# Patient Record
Sex: Male | Born: 1985 | Race: White | Marital: Married
Health system: Southern US, Community
[De-identification: ages and names within clinical notes are randomized; demographics above are authoritative.]

---
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
## 2020-09-06 ENCOUNTER — Emergency Department (HOSPITAL_COMMUNITY): Payer: 59

## 2020-09-06 ENCOUNTER — Observation Stay (HOSPITAL_COMMUNITY)
Admission: EM | Admit: 2020-09-06 | Discharge: 2020-09-08 | Disposition: A | Discharge status: Home / Self Care | Payer: 59 | Attending: General Surgery | Admitting: General Surgery

## 2020-09-06 ENCOUNTER — Other Ambulatory Visit: Payer: Self-pay

## 2020-09-06 DIAGNOSIS — Z20822 Contact with and (suspected) exposure to covid-19: Secondary | ICD-10-CM | POA: Insufficient documentation

## 2020-09-06 DIAGNOSIS — S27321A Contusion of lung, unilateral, initial encounter: Secondary | ICD-10-CM | POA: Diagnosis not present

## 2020-09-06 DIAGNOSIS — Y9319 Activity, other involving water and watercraft: Secondary | ICD-10-CM | POA: Insufficient documentation

## 2020-09-06 DIAGNOSIS — S0093XA Contusion of unspecified part of head, initial encounter: Secondary | ICD-10-CM | POA: Insufficient documentation

## 2020-09-06 DIAGNOSIS — S2241XA Multiple fractures of ribs, right side, initial encounter for closed fracture: Principal | ICD-10-CM | POA: Insufficient documentation

## 2020-09-06 DIAGNOSIS — S0083XA Contusion of other part of head, initial encounter: Secondary | ICD-10-CM

## 2020-09-06 DIAGNOSIS — S299XXA Unspecified injury of thorax, initial encounter: Secondary | ICD-10-CM | POA: Diagnosis present

## 2020-09-06 DIAGNOSIS — S2222XA Fracture of body of sternum, initial encounter for closed fracture: Secondary | ICD-10-CM | POA: Diagnosis not present

## 2020-09-06 DIAGNOSIS — T1490XA Injury, unspecified, initial encounter: Secondary | ICD-10-CM

## 2020-09-06 DIAGNOSIS — T07XXXA Unspecified multiple injuries, initial encounter: Secondary | ICD-10-CM

## 2020-09-06 LAB — I-STAT CHEM 8, ED
BUN: 20 mg/dL (ref 6–20)
Calcium, Ion: 1.11 mmol/L — ABNORMAL LOW (ref 1.15–1.40)
Chloride: 105 mmol/L (ref 98–111)
Creatinine, Ser: 0.8 mg/dL (ref 0.61–1.24)
Glucose, Bld: 147 mg/dL — ABNORMAL HIGH (ref 70–99)
HCT: 45 % (ref 39.0–52.0)
Hemoglobin: 15.3 g/dL (ref 13.0–17.0)
Potassium: 4 mmol/L (ref 3.5–5.1)
Sodium: 141 mmol/L (ref 135–145)
TCO2: 23 mmol/L (ref 22–32)

## 2020-09-06 LAB — SAMPLE TO BLOOD BANK

## 2020-09-06 LAB — ETHANOL: Alcohol, Ethyl (B): <10 mg/dL (ref <10)

## 2020-09-06 LAB — LACTIC ACID, PLASMA: Lactic Acid, Venous: 2.6 mmol/L (ref 0.5–1.9)

## 2020-09-06 LAB — CBC
HCT: 46.2 % (ref 39.0–52.0)
Hemoglobin: 14.6 g/dL (ref 13.0–17.0)
MCH: 26.8 pg (ref 26.0–34.0)
MCHC: 31.6 g/dL (ref 30.0–36.0)
MCV: 84.9 fL (ref 80.0–100.0)
Platelets: 245 10*3/uL (ref 150–400)
RBC: 5.44 MIL/uL (ref 4.22–5.81)
RDW: 13.6 % (ref 11.5–15.5)
WBC: 13.7 10*3/uL — ABNORMAL HIGH (ref 4.0–10.5)
nRBC: 0 % (ref 0.0–0.2)

## 2020-09-06 LAB — COMPREHENSIVE METABOLIC PANEL
ALT: 208 U/L — ABNORMAL HIGH (ref 0–44)
AST: 181 U/L — ABNORMAL HIGH (ref 15–41)
Albumin: 4.2 g/dL (ref 3.5–5.0)
Alkaline Phosphatase: 68 U/L (ref 38–126)
Anion gap: 12 (ref 5–15)
BUN: 16 mg/dL (ref 6–20)
CO2: 21 mmol/L — ABNORMAL LOW (ref 22–32)
Calcium: 9 mg/dL (ref 8.9–10.3)
Chloride: 105 mmol/L (ref 98–111)
Creatinine, Ser: 0.91 mg/dL (ref 0.61–1.24)
GFR calc Af Amer: 59 mL/min — ABNORMAL LOW (ref >60)
GFR calc non Af Amer: 51 mL/min — ABNORMAL LOW (ref >60)
Glucose, Bld: 155 mg/dL — ABNORMAL HIGH (ref 70–99)
Potassium: 4.2 mmol/L (ref 3.5–5.1)
Sodium: 138 mmol/L (ref 135–145)
Total Bilirubin: 1 mg/dL (ref 0.3–1.2)
Total Protein: 7.3 g/dL (ref 6.5–8.1)

## 2020-09-06 LAB — PROTIME-INR
INR: 1.1 (ref 0.8–1.2)
Prothrombin Time: 13.3 seconds (ref 11.4–15.2)

## 2020-09-06 MED ORDER — IOHEXOL 300 MG/ML  SOLN
100.0000 mL | Freq: Once | INTRAMUSCULAR | Status: AC | PRN
  Administered 2020-09-06: 100 mL via INTRAVENOUS

## 2020-09-06 MED ORDER — OXYCODONE-ACETAMINOPHEN 5-325 MG PO TABS
2.0000 | ORAL_TABLET | Freq: Once | ORAL | Status: AC
  Administered 2020-09-06: 2 via ORAL
  Filled 2020-09-06: qty 2

## 2020-09-06 MED ORDER — SODIUM CHLORIDE 0.9 % IV BOLUS
1000.0000 mL | Freq: Once | INTRAVENOUS | Status: AC
  Administered 2020-09-06: 1000 mL via INTRAVENOUS

## 2020-09-06 NOTE — ED Notes (Signed)
Pt attempted to stand to leave AMA, unable to stand due to pain, pt then changed his mind about leaving AMA, pt agrees to stay
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
## 2020-09-06 NOTE — ED Notes (Signed)
Pt states that he does not want a covid swab until he is sure he is being admitted, pt expresses desire to go home.
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
## 2020-09-06 NOTE — Progress Notes (Signed)
Orthopedic Tech Progress Note Patient Details:  Matthew Duran 12/11/1875 [id number] Level 2 Trauma Patient ID: Matthew Duran, male   DOB: 12/11/1875, 34 y.o.   MRN: [id number]   [name] 09/06/2020, 7:08 PM
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
## 2020-09-06 NOTE — ED Notes (Signed)
Pt states that he wants to leave AMA but requesting discharge paperwork and pain medication prescription.
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
## 2020-09-07 LAB — BASIC METABOLIC PANEL
Anion gap: 8 (ref 5–15)
BUN: 14 mg/dL (ref 6–20)
CO2: 26 mmol/L (ref 22–32)
Calcium: 8.9 mg/dL (ref 8.9–10.3)
Chloride: 105 mmol/L (ref 98–111)
Creatinine, Ser: 0.79 mg/dL (ref 0.61–1.24)
GFR calc Af Amer: >60 mL/min (ref >60)
GFR calc non Af Amer: >60 mL/min (ref >60)
Glucose, Bld: 99 mg/dL (ref 70–99)
Potassium: 3.8 mmol/L (ref 3.5–5.1)
Sodium: 139 mmol/L (ref 135–145)

## 2020-09-07 LAB — CBC
HCT: 42.7 % (ref 39.0–52.0)
Hemoglobin: 13.6 g/dL (ref 13.0–17.0)
MCH: 26.8 pg (ref 26.0–34.0)
MCHC: 31.9 g/dL (ref 30.0–36.0)
MCV: 84.1 fL (ref 80.0–100.0)
Platelets: 202 10*3/uL (ref 150–400)
RBC: 5.08 MIL/uL (ref 4.22–5.81)
RDW: 13.7 % (ref 11.5–15.5)
WBC: 13.9 10*3/uL — ABNORMAL HIGH (ref 4.0–10.5)
nRBC: 0 % (ref 0.0–0.2)

## 2020-09-07 LAB — HIV ANTIBODY (ROUTINE TESTING W REFLEX): HIV Screen 4th Generation wRfx: NONREACTIVE

## 2020-09-07 LAB — RESPIRATORY PANEL BY RT PCR (FLU A&B, COVID)
Influenza A by PCR: NEGATIVE
Influenza B by PCR: NEGATIVE
SARS Coronavirus 2 by RT PCR: NEGATIVE

## 2020-09-07 MED ORDER — POLYETHYLENE GLYCOL 3350 17 G PO PACK: 17.0000 g | PACK | Freq: Every day | ORAL | 0 refills | Status: AC | PRN

## 2020-09-07 MED ORDER — GABAPENTIN 600 MG PO TABS: 300.0000 mg | ORAL_TABLET | Freq: Three times a day (TID) | ORAL | Status: DC

## 2020-09-07 MED ORDER — OXYCODONE HCL 5 MG PO TABS
5.0000 mg | ORAL_TABLET | Freq: Four times a day (QID) | ORAL | 0 refills | Status: AC | PRN
Start: 2020-09-07 — End: ?

## 2020-09-07 MED ORDER — IBUPROFEN 600 MG PO TABS
600.0000 mg | ORAL_TABLET | Freq: Four times a day (QID) | ORAL | Status: DC | PRN
  Administered 2020-09-07 (×3): 600 mg via ORAL
  Filled 2020-09-07 (×3): qty 1

## 2020-09-07 MED ORDER — DOCUSATE SODIUM 100 MG PO CAPS: 100.0000 mg | ORAL_CAPSULE | Freq: Two times a day (BID) | ORAL | 0 refills | Status: AC | PRN

## 2020-09-07 MED ORDER — OXYCODONE HCL 5 MG PO TABS
5.0000 mg | ORAL_TABLET | Freq: Four times a day (QID) | ORAL | Status: DC | PRN
  Administered 2020-09-07 – 2020-09-08 (×2): 10 mg via ORAL
  Filled 2020-09-07 (×2): qty 2

## 2020-09-07 MED ORDER — HYDRALAZINE HCL 20 MG/ML IJ SOLN: 10.0000 mg | INTRAMUSCULAR | Status: DC | PRN

## 2020-09-07 MED ORDER — ENOXAPARIN SODIUM 30 MG/0.3ML ~~LOC~~ SOLN
30.0000 mg | Freq: Two times a day (BID) | SUBCUTANEOUS | Status: DC
  Administered 2020-09-07 – 2020-09-08 (×3): 30 mg via SUBCUTANEOUS
  Filled 2020-09-07 (×4): qty 0.3

## 2020-09-07 MED ORDER — HYDROMORPHONE HCL 1 MG/ML IJ SOLN
0.5000 mg | INTRAMUSCULAR | Status: DC | PRN
  Administered 2020-09-07: 0.5 mg via INTRAVENOUS
  Filled 2020-09-07: qty 1

## 2020-09-07 MED ORDER — LACTATED RINGERS IV SOLN: INTRAVENOUS | Status: DC

## 2020-09-07 MED ORDER — ONDANSETRON HCL 4 MG/2ML IJ SOLN: 4.0000 mg | Freq: Four times a day (QID) | INTRAMUSCULAR | Status: DC | PRN

## 2020-09-07 MED ORDER — ACETAMINOPHEN 500 MG PO TABS
1000.0000 mg | ORAL_TABLET | Freq: Four times a day (QID) | ORAL | Status: DC
  Administered 2020-09-07 – 2020-09-08 (×6): 1000 mg via ORAL
  Filled 2020-09-07 (×6): qty 2

## 2020-09-07 MED ORDER — GABAPENTIN 600 MG PO TABS
300.0000 mg | ORAL_TABLET | Freq: Three times a day (TID) | ORAL | Status: DC
  Administered 2020-09-07 – 2020-09-08 (×6): 300 mg via ORAL
  Filled 2020-09-07 (×2): qty 1
  Filled 2020-09-07 (×2): qty 0.5
  Filled 2020-09-07 (×4): qty 1

## 2020-09-07 MED ORDER — DOCUSATE SODIUM 100 MG PO CAPS
200.0000 mg | ORAL_CAPSULE | Freq: Two times a day (BID) | ORAL | Status: DC
  Administered 2020-09-07 – 2020-09-08 (×3): 200 mg via ORAL
  Filled 2020-09-07 (×4): qty 2

## 2020-09-07 MED ORDER — ACETAMINOPHEN 500 MG PO TABS: 1000.0000 mg | ORAL_TABLET | Freq: Three times a day (TID) | ORAL | 0 refills | Status: AC | PRN

## 2020-09-07 MED ORDER — METHOCARBAMOL 500 MG PO TABS: 500.0000 mg | ORAL_TABLET | Freq: Four times a day (QID) | ORAL | 0 refills | Status: AC | PRN

## 2020-09-07 MED ORDER — ONDANSETRON 4 MG PO TBDP: 4.0000 mg | ORAL_TABLET | Freq: Four times a day (QID) | ORAL | Status: DC | PRN

## 2020-09-07 NOTE — Progress Notes (Signed)
Patient ambulated around the hall but unable to walk far due to pain. Will cancel discharge per MD.

## 2020-09-07 NOTE — TOC CAGE-AID Note (Signed)
Transition of Care [facility]) - CAGE-AID Screening   Patient Details  Name: Matthew Duran MRN: [id number] Date of Birth: [birth year]  [facility] CM/SW Contact:    Emeterio Reeve, [place] Phone Number: 09/07/2020, 11:33 AM   Clinical Narrative:  CSW met with pt at bedside. CSW introduced self and explained her role at the hospital.  Pt reports he drinks alcohol occasionally, less than monthly. Pt denies current substance use. Pt did not need resources at this time.   CAGE-AID Screening:    Have You Ever Felt You Ought to Cut Down on Your Drinking or Drug Use?: No Have People Annoyed You By Critizing Your Drinking Or Drug Use?: No Have You Felt Bad Or Guilty About Your Drinking Or Drug Use?: No Have You Ever Had a Drink or Used Drugs First Thing In The Morning to Steady Your Nerves or to Get Rid of a Hangover?: No CAGE-AID Score: 0  Substance Abuse Education Offered: Yes  Substance abuse interventions: Patient Counseling   Emeterio Reeve, Latanya Presser, [place] Social Worker [phone]
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
## 2020-09-07 NOTE — Plan of Care (Signed)

## 2020-09-07 NOTE — Discharge Instructions (Addendum)
Follow up with a Primary Care Provider. Your Liver Function Tests were noted to be elevated during hospitalization. You will need follow up labs as an outpatient to ensure these are improving.    RIB FRACTURES  HOME INSTRUCTIONS   1. PAIN CONTROL:  1. Pain is best controlled by a usual combination of three different methods TOGETHER:  i. Ice/Heat ii. Over the counter pain medication iii. Prescription pain medication 2. You may experience some swelling and bruising in area of broken ribs. Ice packs or heating pads (30-60 minutes up to 6 times a day) will help. Use ice for the first few days to help decrease swelling and bruising, then switch to heat to help relax tight/sore spots and speed recovery. Some people prefer to use ice alone, heat alone, alternating between ice & heat. Experiment to what works for you. Swelling and bruising can take several weeks to resolve.  3. It is helpful to take an over-the-counter pain medication regularly for the first few weeks. Choose one of the following that works best for you:  i. Naproxen (Aleve, etc) Two 220mg  tabs twice a day ii. Ibuprofen (Advil, etc) Three 200mg  tabs four times a day (every meal & bedtime) iii. Acetaminophen (Tylenol, etc) 500-650mg  four times a day (every meal & bedtime) 4. A prescription for pain medication (such as oxycodone, hydrocodone, etc) may be given to you upon discharge. Take your pain medication as prescribed.  i. If you are having problems/concerns with the prescription medicine (does not control pain, nausea, vomiting, rash, itching, etc), please call [phone] to see if we need to switch you to a different pain medicine that will work better for you and/or control your side effect better. ii. If you need a refill on your pain medication, please contact your pharmacy. They will contact our office to request authorization. Prescriptions will not be filled after 5 pm or on week-ends. 1. Avoid getting constipated. When  taking pain medications, it is common to experience some constipation. Increasing fluid intake and taking a fiber supplement (such as Metamucil, Citrucel, FiberCon, MiraLax, etc) 1-2 times a day regularly will usually help prevent this problem from occurring. A mild laxative (prune juice, Milk of Magnesia, MiraLax, etc) should be taken according to package directions if there are no bowel movements after 48 hours.  2. Watch out for diarrhea. If you have many loose bowel movements, simplify your diet to bland foods & liquids for a few days. Stop any stool softeners and decrease your fiber supplement. Switching to mild anti-diarrheal medications (Kayopectate, Pepto Bismol) can help. If this worsens or does not improve, please call us. 3. FOLLOW UP  a. If a follow up appointment is needed one will be scheduled for you. If none is needed with our trauma team, please follow up with your primary care provider within 2-3 weeks from discharge. Please call [facility] at [phone] if you have any questions about follow up.  b. If you have any orthopedic or other injuries you will need to follow up as outlined in your follow up instructions.   WHEN TO CALL us [phone]:  1. Poor pain control 2. Reactions / problems with new medications (rash/itching, nausea, etc)  3. Fever over 101.5 F (38.5 C) 4. Worsening swelling or bruising 5. Worsening pain, productive cough, difficulty breathing or any other concerning symptoms  The clinic staff is available to answer your questions during regular business hours (8:30am-5pm). Please dont hesitate to call and ask to speak  to one of our nurses for clinical concerns.  If you have a medical emergency, go to the nearest emergency room or call 911.  A surgeon from [facility] is always on call at the [facility], [place]  [address], [address], [place], [place] [address] ?  MAIN: [phone] ? TOLL FREE: [phone] ?  FAX  [phone]  [email or web address]      Information on Rib Fractures  A rib fracture is a break or crack in one of the bones of the ribs. The ribs are long, curved bones that wrap around your chest and attach to your spine and your breastbone. The ribs protect your heart, lungs, and other organs in the chest. A broken or cracked rib is often painful but is not usually serious. Most rib fractures heal on their own over time. However, rib fractures can be more serious if multiple ribs are broken or if broken ribs move out of place and push against other structures or organs. What are the causes? This condition is caused by:  Repetitive movements with high force, such as pitching a baseball or having severe coughing spells.  A direct blow to the chest, such as a sports injury, a car accident, or a fall.  Cancer that has spread to the bones, which can weaken bones and cause them to break. What are the signs or symptoms? Symptoms of this condition include:  Pain when you breathe in or cough.  Pain when someone presses on the injured area.  Feeling short of breath. How is this diagnosed? This condition is diagnosed with a physical exam and medical history. Imaging tests may also be done, such as:  Chest X-ray.  CT scan.  MRI.  Bone scan.  Chest ultrasound. How is this treated? Treatment for this condition depends on the severity of the fracture. Most rib fractures usually heal on their own in 1-3 months. Sometimes healing takes longer if there is a cough that does not stop or if there are other activities that make the injury worse (aggravating factors). While you heal, you will be given medicines to control the pain. You will also be taught deep breathing exercises. Severe injuries may require hospitalization or surgery. Follow these instructions at home: Managing pain, stiffness, and swelling  If directed, apply ice to the injured area. ? Put ice in a plastic  bag. ? Place a towel between your skin and the bag. ? Leave the ice on for 20 minutes, 2-3 times a day.  Take over-the-counter and prescription medicines only as told by your health care provider. Activity  Avoid a lot of activity and any activities or movements that cause pain. Be careful during activities and avoid bumping the injured rib.  Slowly increase your activity as told by your health care provider. General instructions  Do deep breathing exercises as told by your health care provider. This helps prevent pneumonia, which is a common complication of a broken rib. Your health care provider may instruct you to: ? Take deep breaths several times a day. ? Try to cough several times a day, holding a pillow against the injured area. ? Use a device called incentive spirometer to practice deep breathing several times a day.  Drink enough fluid to keep your urine pale yellow.  Do not wear a rib belt or binder. These restrict breathing, which can lead to pneumonia.  Keep all follow-up visits as told by your health care provider. This is  important. Contact a health care provider if:  You have a fever. Get help right away if:  You have difficulty breathing or you are short of breath.  You develop a cough that does not stop, or you cough up thick or bloody sputum.  You have nausea, vomiting, or pain in your abdomen.  Your pain gets worse and medicine does not help. Summary  A rib fracture is a break or crack in one of the bones of the ribs.  A broken or cracked rib is often painful but is not usually serious.  Most rib fractures heal on their own over time.  Treatment for this condition depends on the severity of the fracture.  Avoid a lot of activity and any activities or movements that cause pain. This information is not intended to replace advice given to you by your health care provider. Make sure you discuss any questions you have with your health care  provider. Document Released: 11/27/2005 Document Revised: 02/26/2017 Document Reviewed: 02/26/2017 Elsevier Interactive Patient Education  2019 [employer].

## 2020-09-08 MED ORDER — TRAMADOL HCL 50 MG PO TABS
50.0000 mg | ORAL_TABLET | Freq: Four times a day (QID) | ORAL | Status: DC
  Administered 2020-09-08 (×2): 50 mg via ORAL
  Filled 2020-09-08 (×2): qty 1

## 2020-09-08 MED ORDER — METHOCARBAMOL 750 MG PO TABS
750.0000 mg | ORAL_TABLET | Freq: Three times a day (TID) | ORAL | Status: DC
  Administered 2020-09-08 (×2): 750 mg via ORAL
  Filled 2020-09-08 (×2): qty 1

## 2020-09-08 MED ORDER — TRAMADOL HCL 50 MG PO TABS
50.0000 mg | ORAL_TABLET | Freq: Four times a day (QID) | ORAL | 0 refills | Status: AC | PRN
Start: 2020-09-08 — End: ?

## 2020-09-08 MED ORDER — IBUPROFEN 600 MG PO TABS
600.0000 mg | ORAL_TABLET | Freq: Four times a day (QID) | ORAL | Status: DC
  Administered 2020-09-08 (×2): 600 mg via ORAL
  Filled 2020-09-08 (×2): qty 1
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
## 2020-09-08 NOTE — Plan of Care (Signed)
  Problem: Education: Goal: Knowledge of General Education information will improve Description: Including pain rating scale, medication(s)/side effects and non-pharmacologic comfort measures Outcome: Progressing   Problem: Health Behavior/Discharge Planning: Goal: Ability to manage health-related needs will improve Outcome: Progressing   Problem: Activity: Goal: Risk for activity intolerance will decrease Outcome: Progressing   Problem: Pain Managment: Goal: General experience of comfort will improve Outcome: Progressing  Patient able to participate in PT today. Patient states pain is better controlled today with scheduled meds.
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
## 2020-09-08 NOTE — Plan of Care (Signed)
  Problem: Education: Goal: Knowledge of General Education information will improve Description: Including pain rating scale, medication(s)/side effects and non-pharmacologic comfort measures 09/08/2020 1705 by Luellen Pucker, RN Outcome: Adequate for Discharge 09/08/2020 1537 by Luellen Pucker, RN Outcome: Progressing   Problem: Health Behavior/Discharge Planning: Goal: Ability to manage health-related needs will improve 09/08/2020 1705 by Luellen Pucker, RN Outcome: Adequate for Discharge 09/08/2020 1537 by Luellen Pucker, RN Outcome: Progressing   Problem: Clinical Measurements: Goal: Ability to maintain clinical measurements within normal limits will improve Outcome: Adequate for Discharge Goal: Will remain free from infection Outcome: Adequate for Discharge Goal: Diagnostic test results will improve Outcome: Adequate for Discharge Goal: Respiratory complications will improve Outcome: Adequate for Discharge Goal: Cardiovascular complication will be avoided Outcome: Adequate for Discharge   Problem: Activity: Goal: Risk for activity intolerance will decrease 09/08/2020 1705 by Luellen Pucker, RN Outcome: Adequate for Discharge 09/08/2020 1537 by Luellen Pucker, RN Outcome: Progressing   Problem: Nutrition: Goal: Adequate nutrition will be maintained Outcome: Adequate for Discharge   Problem: Coping: Goal: Level of anxiety will decrease Outcome: Adequate for Discharge   Problem: Elimination: Goal: Will not experience complications related to bowel motility Outcome: Adequate for Discharge Goal: Will not experience complications related to urinary retention Outcome: Adequate for Discharge   Problem: Pain Managment: Goal: General experience of comfort will improve 09/08/2020 1705 by Luellen Pucker, RN Outcome: Adequate for Discharge 09/08/2020 1537 by Luellen Pucker, RN Outcome: Progressing   Problem: Safety: Goal: Ability to remain free from injury will  improve Outcome: Adequate for Discharge   Problem: Skin Integrity: Goal: Risk for impaired skin integrity will decrease Outcome: Adequate for Discharge

## 2020-09-08 NOTE — Progress Notes (Signed)
Nsg Discharge Note  Admit Date:  09/06/2020 Discharge date: 09/08/2020   Xeng A Dake to be D/C'd Home per MD order.  AVS completed.  Copy for chart, and copy for patient signed, and dated. Patient/caregiver able to verbalize understanding.  Discharge Medication: Allergies as of 09/08/2020   No Known Allergies     Medication List    TAKE these medications   acetaminophen 500 MG tablet Commonly known as: TYLENOL Take 2 tablets (1,000 mg total) by mouth every 8 (eight) hours as needed.   docusate sodium 100 MG capsule Commonly known as: COLACE Take 1 capsule (100 mg total) by mouth 2 (two) times daily as needed for mild constipation.   methocarbamol 500 MG tablet Commonly known as: Robaxin Take 1 tablet (500 mg total) by mouth every 6 (six) hours as needed for muscle spasms.   oxyCODONE 5 MG immediate release tablet Commonly known as: Oxy IR/ROXICODONE Take 1 tablet (5 mg total) by mouth every 6 (six) hours as needed for breakthrough pain.   polyethylene glycol 17 g packet Commonly known as: MIRALAX / GLYCOLAX Take 17 g by mouth daily as needed for mild constipation.   traMADol 50 MG tablet Commonly known as: ULTRAM Take 1 tablet (50 mg total) by mouth every 6 (six) hours as needed.            Durable Medical Equipment  (From admission, onward)         Start     Ordered   09/08/20 1201  For home use only DME Cane  Once        09/08/20 1201          Discharge Assessment: Vitals:   09/08/20 1149 09/08/20 1422  BP: 126/87 117/62  Pulse: 93 92  Resp: 16 16  Temp: 98.5 F (36.9 C) 98.6 F (37 C)  SpO2: 93% 97%   Skin clean, dry and intact without evidence of skin break down, no evidence of skin tears noted. IV catheter discontinued intact. Site without signs and symptoms of complications - no redness or edema noted at insertion site, patient denies c/o pain - only slight tenderness at site.  Dressing with slight pressure applied.  D/c  Instructions-Education: Discharge instructions given to patient/family with verbalized understanding. D/c education completed with patient/family including follow up instructions, medication list, d/c activities limitations if indicated, with other d/c instructions as indicated by MD - patient able to verbalize understanding, all questions fully answered. Patient instructed to return to ED, call 911, or call MD for any changes in condition.  Patient escorted via WC, and D/C home via private auto.  Luellen Pucker, RN 09/08/2020 5:07 PM
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
## 2022-10-02 DIAGNOSIS — M25531 Pain in right wrist: Secondary | ICD-10-CM | POA: Diagnosis not present

## 2022-10-02 DIAGNOSIS — M25571 Pain in right ankle and joints of right foot: Secondary | ICD-10-CM | POA: Diagnosis not present

## 2022-10-02 DIAGNOSIS — M25572 Pain in left ankle and joints of left foot: Secondary | ICD-10-CM | POA: Diagnosis not present

## 2022-10-02 DIAGNOSIS — M25561 Pain in right knee: Secondary | ICD-10-CM | POA: Diagnosis not present

## 2022-10-05 DIAGNOSIS — M25561 Pain in right knee: Secondary | ICD-10-CM | POA: Diagnosis not present

## 2022-11-13 DIAGNOSIS — M25561 Pain in right knee: Secondary | ICD-10-CM | POA: Diagnosis not present
